# Patient Record
Sex: Female | Born: 1995 | Race: White | Hispanic: No | Marital: Single
Health system: Southern US, Community
[De-identification: ages and names within clinical notes are randomized; demographics above are authoritative.]

## PROBLEM LIST (undated history)

## (undated) ENCOUNTER — Ambulatory Visit (HOSPITAL_COMMUNITY): Payer: Self-pay

## (undated) ENCOUNTER — Inpatient Hospital Stay (HOSPITAL_COMMUNITY): Payer: Self-pay

## (undated) ENCOUNTER — Emergency Department (HOSPITAL_COMMUNITY): Admission: EM

## (undated) HISTORY — PX: HERNIA REPAIR: SHX51

---
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
## 2016-03-25 ENCOUNTER — Emergency Department (HOSPITAL_COMMUNITY)
Admission: EM | Admit: 2016-03-25 | Discharge: 2016-03-25 | Disposition: A | Discharge status: Home / Self Care | Payer: Medicaid Other | Attending: Emergency Medicine | Admitting: Emergency Medicine

## 2016-03-25 ENCOUNTER — Encounter (HOSPITAL_COMMUNITY): Payer: Self-pay

## 2016-03-25 ENCOUNTER — Emergency Department (HOSPITAL_COMMUNITY): Payer: Medicaid Other

## 2016-03-25 DIAGNOSIS — Z7722 Contact with and (suspected) exposure to environmental tobacco smoke (acute) (chronic): Secondary | ICD-10-CM | POA: Insufficient documentation

## 2016-03-25 DIAGNOSIS — R1013 Epigastric pain: Secondary | ICD-10-CM

## 2016-03-25 DIAGNOSIS — R109 Unspecified abdominal pain: Secondary | ICD-10-CM | POA: Diagnosis present

## 2016-03-25 DIAGNOSIS — Z79899 Other long term (current) drug therapy: Secondary | ICD-10-CM | POA: Insufficient documentation

## 2016-03-25 LAB — URINALYSIS, ROUTINE W REFLEX MICROSCOPIC
Bilirubin Urine: NEGATIVE
Glucose, UA: NEGATIVE mg/dL
Hgb urine dipstick: NEGATIVE
KETONES UR: NEGATIVE mg/dL
LEUKOCYTES UA: NEGATIVE
NITRITE: NEGATIVE
PH: 5.5 (ref 5.0–8.0)
Protein, ur: NEGATIVE mg/dL
Specific Gravity, Urine: 1.015 (ref 1.005–1.030)

## 2016-03-25 LAB — COMPREHENSIVE METABOLIC PANEL
ALT: 24 U/L (ref 14–54)
ANION GAP: 9 (ref 5–15)
AST: 27 U/L (ref 15–41)
Albumin: 4.1 g/dL (ref 3.5–5.0)
Alkaline Phosphatase: 43 U/L (ref 38–126)
BILIRUBIN TOTAL: 0.5 mg/dL (ref 0.3–1.2)
BUN: 12 mg/dL (ref 6–20)
CO2: 24 mmol/L (ref 22–32)
Calcium: 9.5 mg/dL (ref 8.9–10.3)
Chloride: 108 mmol/L (ref 101–111)
Creatinine, Ser: 0.68 mg/dL (ref 0.44–1.00)
Glucose, Bld: 106 mg/dL — ABNORMAL HIGH (ref 65–99)
POTASSIUM: 4.3 mmol/L (ref 3.5–5.1)
Sodium: 141 mmol/L (ref 135–145)
TOTAL PROTEIN: 6.7 g/dL (ref 6.5–8.1)

## 2016-03-25 LAB — CBC
HEMATOCRIT: 42.7 % (ref 36.0–46.0)
Hemoglobin: 14 g/dL (ref 12.0–15.0)
MCH: 30 pg (ref 26.0–34.0)
MCHC: 32.8 g/dL (ref 30.0–36.0)
MCV: 91.4 fL (ref 78.0–100.0)
Platelets: 199 10*3/uL (ref 150–400)
RBC: 4.67 MIL/uL (ref 3.87–5.11)
RDW: 12.7 % (ref 11.5–15.5)
WBC: 10.1 10*3/uL (ref 4.0–10.5)

## 2016-03-25 LAB — LIPASE, BLOOD: Lipase: 35 U/L (ref 11–51)

## 2016-03-25 LAB — POC URINE PREG, ED: Preg Test, Ur: NEGATIVE

## 2016-03-25 MED ORDER — MORPHINE SULFATE (PF) 4 MG/ML IV SOLN
4.0000 mg | Freq: Once | INTRAVENOUS | Status: AC
  Administered 2016-03-25: 4 mg via INTRAVENOUS
  Filled 2016-03-25: qty 1

## 2016-03-25 MED ORDER — IOPAMIDOL (ISOVUE-300) INJECTION 61%
INTRAVENOUS | Status: AC
  Administered 2016-03-25: 100 mL
  Filled 2016-03-25: qty 100

## 2016-03-25 MED ORDER — SODIUM CHLORIDE 0.9 % IV BOLUS (SEPSIS)
1000.0000 mL | Freq: Once | INTRAVENOUS | Status: AC
  Administered 2016-03-25: 1000 mL via INTRAVENOUS

## 2016-03-25 MED ORDER — PANTOPRAZOLE SODIUM 40 MG PO TBEC
40.0000 mg | DELAYED_RELEASE_TABLET | Freq: Once | ORAL | Status: AC
  Administered 2016-03-25: 40 mg via ORAL
  Filled 2016-03-25: qty 1

## 2016-03-25 MED ORDER — ONDANSETRON HCL 4 MG/2ML IJ SOLN
4.0000 mg | Freq: Once | INTRAMUSCULAR | Status: AC
  Administered 2016-03-25: 4 mg via INTRAVENOUS
  Filled 2016-03-25: qty 2

## 2016-03-25 MED ORDER — PANTOPRAZOLE SODIUM 20 MG PO TBEC: 20.0000 mg | DELAYED_RELEASE_TABLET | Freq: Every day | ORAL | 2 refills | Status: DC

## 2016-03-25 NOTE — ED Triage Notes (Signed)
Per pt, Pt has had stabbing squeezing pain to her epigastric region for over a year. Reports decrease with pain medication, but returns. Pt has a Hx of gallbladder removal. Reports abnormal bowels due to gallbladder removal and some nausea this morning.

## 2016-03-25 NOTE — ED Notes (Signed)
Pt in RR2

## 2016-03-25 NOTE — ED Notes (Signed)
Pt back in room.

## 2016-03-25 NOTE — ED Provider Notes (Signed)
[facility]-EMERGENCY DEPT Provider Note   CSN: [id number] Arrival date & time: 03/25/16  0850     History   Chief Complaint Chief Complaint  Patient presents with  . Abdominal Pain    HPI Tara Blanchard is a 20 y.o. female.  HPI    Tara Blanchard is a 20 y.o. female, with a history of cholecystectomy, presenting to the ED with abdominal pain intermittent for the last 6 months. Averages about 2 times a week. Woke up with the pain this morning at around 7 AM. Has been evaluated for this before and diagnosed with constipation. Endorses soft stools and diarrhea with the pain, as well as nausea. Pain rates it 7/10, epigastric, sharp in nature, radiates around the right flank.  Ibuprofen helps to relieve the pain, last dose was 600 mg at 7:30 AM this morning. BM do not affect the pain. Last BM was this morning. Denies alcohol or illicit drug use. Denies vomiting, fever/chills, hematochezia/melena,   Last food intake was last night. Currently menstruating beginning Sept 30.    History reviewed. No pertinent past medical history.  Patient Active Problem List   Diagnosis Date Noted  . Symptomatic cholelithiasis 08/09/2014    Past Surgical History:  Procedure Laterality Date  . CHOLECYSTECTOMY  08/09/2014  . CHOLECYSTECTOMY  08/09/2014   Procedure: LAPAROSCOPIC CHOLECYSTECTOMY;  Surgeon: Violeta Gelinas, MD;  Location: [facility] OR;  Service: General;;  . HERNIA REPAIR      OB History    No data available       Home Medications    Prior to Admission medications   Medication Sig Start Date End Date Taking? Authorizing Provider  acetaminophen (TYLENOL) 500 MG tablet Take 1,000 mg by mouth every 6 (six) hours as needed.    Historical Provider, MD  dicyclomine (BENTYL) 20 MG tablet Take 1 tablet (20 mg total) by mouth 4 (four) times daily -  before meals and at bedtime. 02/17/15   Lavella Hammock, MD  HYDROcodone-acetaminophen (NORCO/VICODIN) 5-325 MG per tablet Take 1-2 tablets by  mouth every 6 (six) hours as needed for moderate pain. 08/10/14   Emina Riebock, NP  pantoprazole (PROTONIX) 20 MG tablet Take 1 tablet (20 mg total) by mouth daily. 03/25/16   Lillyauna Jenkinson C Mairen Wallenstein, PA-C  polyethylene glycol powder (GLYCOLAX/MIRALAX) powder 1/2 - 1 capful in 8 oz of liquid daily as needed to have 1-2 soft bm 03/08/15   Francee Piccolo, PA-C    Family History No family history on file.  Social History Social History  Substance Use Topics  . Smoking status: Passive Smoke Exposure - Never Smoker  . Smokeless tobacco: Never Used  . Alcohol use No     Allergies   Review of patient's allergies indicates no known allergies.   Review of Systems Review of Systems  Constitutional: Negative for chills and fever.  Gastrointestinal: Positive for abdominal pain, diarrhea and nausea. Negative for blood in stool and vomiting.  Genitourinary: Positive for flank pain. Negative for hematuria and pelvic pain.  Musculoskeletal: Negative for back pain.  All other systems reviewed and are negative.    Physical Exam Updated Vital Signs BP 116/95 (BP Location: Left Arm)   Pulse 71   Temp 98.7 F (37.1 C) (Oral)   Resp 18   Ht 5\' 4"  (1.626 m)   Wt 110.2 kg   SpO2 99%   BMI 41.71 kg/m   Physical Exam  Constitutional: She appears well-developed and well-nourished. No distress.  HENT:  Head: Normocephalic and  atraumatic.  Eyes: Conjunctivae are normal.  Neck: Neck supple.  Cardiovascular: Normal rate, regular rhythm, normal heart sounds and intact distal pulses.   Pulmonary/Chest: Effort normal and breath sounds normal. No respiratory distress.  Abdominal: Soft. There is tenderness in the epigastric area. There is no guarding.  Musculoskeletal: She exhibits no edema or tenderness.  Lymphadenopathy:    She has no cervical adenopathy.  Neurological: She is alert.  Skin: Skin is warm and dry. She is not diaphoretic.  Psychiatric: She has a normal mood and affect. Her behavior is  normal.  Nursing note and vitals reviewed.    ED Treatments / Results  Labs (all labs ordered are listed, but only abnormal results are displayed) Labs Reviewed  COMPREHENSIVE METABOLIC PANEL - Abnormal; Notable for the following:       Result Value   Glucose, Bld 106 (*)    All other components within normal limits  LIPASE, BLOOD  CBC  URINALYSIS, ROUTINE W REFLEX MICROSCOPIC (NOT AT [facility])  POC URINE PREG, ED    EKG  EKG Interpretation None       Radiology Ct Abdomen Pelvis W Contrast  Result Date: 03/25/2016 CLINICAL DATA:  Stabbing squeezing pain in epigastric region for over a year, epigastric tenderness, prior cholecystectomy EXAM: CT ABDOMEN AND PELVIS WITH CONTRAST TECHNIQUE: Multidetector CT imaging of the abdomen and pelvis was performed using the standard protocol following bolus administration of intravenous contrast. Sagittal and coronal MPR images reconstructed from axial data set. CONTRAST:  ISOVUE-300 IOPAMIDOL (ISOVUE-300) INJECTION 61% IV. No oral contrast administered. COMPARISON:  None FINDINGS: Lower chest: Lung bases clear Hepatobiliary: Gallbladder surgically absent. Liver normal appearance. Pancreas: Normal appearance Spleen: Normal appearance Adrenals/Urinary Tract: Adrenal glands normal appearance. Kidneys, ureters and bladder normal appearance. Stomach/Bowel: Normal appendix. Stomach and bowel loops normal appearance Vascular/Lymphatic: Vascular structures normal appearance. Normal sized scattered retroperitoneal lymph nodes without definite abdominal or pelvic adenopathy. Reproductive: Normal appearing uterus and adnexa. Other: No free air, free fluid, hernia, or inflammatory process Musculoskeletal: Normal appearance IMPRESSION: Post cholecystectomy. No acute abdominal or pelvic abnormalities. Electronically Signed   By: Ulyses Southward M.D.   On: 03/25/2016 13:14    Procedures Procedures (including critical care time)  Medications Ordered in  ED Medications  sodium chloride 0.9 % bolus 1,000 mL (1,000 mLs Intravenous New Bag/Given 03/25/16 1057)  morphine 4 MG/ML injection 4 mg (4 mg Intravenous Given 03/25/16 1100)  ondansetron (ZOFRAN) injection 4 mg (4 mg Intravenous Given 03/25/16 1059)  pantoprazole (PROTONIX) EC tablet 40 mg (40 mg Oral Given 03/25/16 1057)  iopamidol (ISOVUE-300) 61 % injection (100 mLs  Contrast Given 03/25/16 1251)     Initial Impression / Assessment and Plan / ED Course  I have reviewed the triage vital signs and the nursing notes.  Pertinent labs & imaging results that were available during my care of the patient were reviewed by me and considered in my medical decision making (see chart for details).  Clinical Course     Patient presents with epigastric abdominal pain, intermittent for the last year. The only other linked to the patient's symptoms is soft stools that follow, but do not relieve, the pain. Although the patient states she has been having this pain for about 6 months, she has notes in the system detailing similar pain going back to over 18 months ago.  No abnormalities on the patient's labs or CT. Patient to follow up with GI. Discharge with PPI.  Findings and plan of care discussed  with Scott Goldston, MD.    Vitals:   03/25/16 [id number] Loveless53 03/25/16 1202  BP: 116/95 (!) 108/50  Pulse: 71 61  Resp: 18 17  Temp: 98.7 F (37.1 C)   TempSrc: Oral   SpO2: 99% 99%  Weight: 110.2 kg   Height: 5\' 4"  (1.626 m)      Final Clinical Impressions(s) / ED Diagnoses   Final diagnoses:  Epigastric pain    New Prescriptions New Prescriptions   PANTOPRAZOLE (PROTONIX) 20 MG TABLET    Take 1 tablet (20 mg total) by mouth daily.     Anselm PancoastShawn C Stasha Naraine, PA-C 03/25/16 1347    Pricilla LovelessScott Goldston, MD 03/25/16 1742

## 2016-03-25 NOTE — Discharge Instructions (Signed)
You have been seen today for abdominal pain. Your imaging and lab tests showed no significant abnormalities. Follow up with the gastroenterology specialists as soon as possible. Call the number provided to set up an appointment. Follow up with a primary care provider as needed. Return to ED should symptoms worsen.  You are being placed on a medication called Protonix. This is for acid reflux and to protect your stomach from stomach acid. It is possible that there is an over production of acid in your stomach. This may be what is causing your pain. Begin taking the Protonix tomorrow. Take this medication 20-30 minutes prior to the first meal of the day. Take this medication every day regardless of how you feel.

## 2016-05-15 IMAGING — DX DG ABDOMEN 1V
1 series · 1 of 1 positions shown · non-contrast
Comparison: 08/14/2014

CLINICAL DATA: Lower abdominal pain and diarrhea for 2 weeks.

EXAM:
ABDOMEN - 1 VIEW

[abdomen kub]
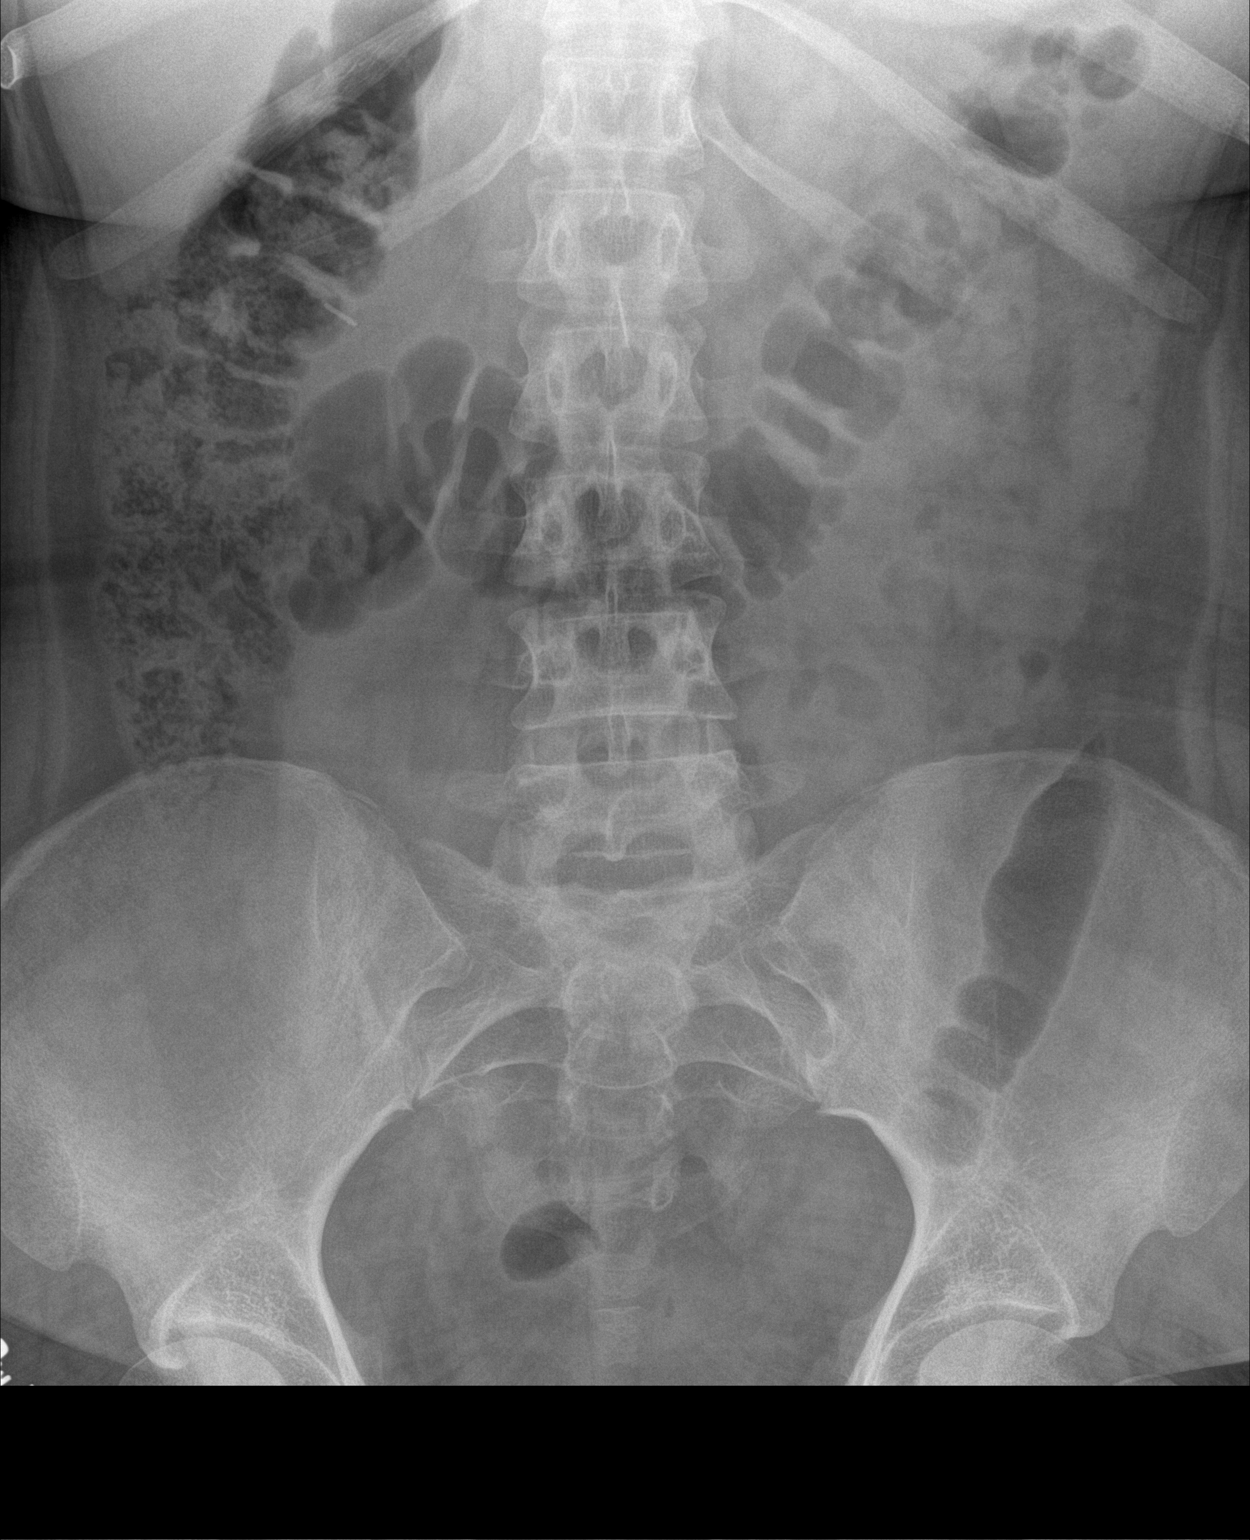

[1 of 1 positions shown; findings below may reference images not displayed]

FINDINGS: No dilated bowel loops. Small volume of stool in the right colon.
Air within normal caliber transverse and descending colon. No
evidence of free air. A surgical clip projects over the right upper
abdomen. No radiopaque calculi. No osseous abnormality.
IMPRESSION: Normal bowel gas pattern.

## 2016-06-19 ENCOUNTER — Emergency Department (HOSPITAL_COMMUNITY): Payer: Medicaid Other

## 2016-06-19 ENCOUNTER — Emergency Department (HOSPITAL_COMMUNITY)
Admission: EM | Admit: 2016-06-19 | Discharge: 2016-06-19 | Disposition: A | Discharge status: Home / Self Care | Payer: Medicaid Other | Attending: Emergency Medicine | Admitting: Emergency Medicine

## 2016-06-19 ENCOUNTER — Encounter (HOSPITAL_COMMUNITY): Payer: Self-pay

## 2016-06-19 DIAGNOSIS — Z7722 Contact with and (suspected) exposure to environmental tobacco smoke (acute) (chronic): Secondary | ICD-10-CM | POA: Insufficient documentation

## 2016-06-19 DIAGNOSIS — J069 Acute upper respiratory infection, unspecified: Secondary | ICD-10-CM | POA: Diagnosis not present

## 2016-06-19 DIAGNOSIS — B9789 Other viral agents as the cause of diseases classified elsewhere: Secondary | ICD-10-CM

## 2016-06-19 DIAGNOSIS — R05 Cough: Secondary | ICD-10-CM | POA: Diagnosis present

## 2016-06-19 LAB — RAPID STREP SCREEN (MED CTR MEBANE ONLY): Streptococcus, Group A Screen (Direct): NEGATIVE

## 2016-06-19 LAB — CBC
HCT: 42.4 % (ref 36.0–46.0)
Hemoglobin: 14.2 g/dL (ref 12.0–15.0)
MCH: 30.3 pg (ref 26.0–34.0)
MCHC: 33.5 g/dL (ref 30.0–36.0)
MCV: 90.4 fL (ref 78.0–100.0)
PLATELETS: 240 10*3/uL (ref 150–400)
RBC: 4.69 MIL/uL (ref 3.87–5.11)
RDW: 12.6 % (ref 11.5–15.5)
WBC: 10.2 10*3/uL (ref 4.0–10.5)

## 2016-06-19 LAB — COMPREHENSIVE METABOLIC PANEL
ALK PHOS: 51 U/L (ref 38–126)
ALT: 25 U/L (ref 14–54)
AST: 31 U/L (ref 15–41)
Albumin: 4 g/dL (ref 3.5–5.0)
Anion gap: 8 (ref 5–15)
BILIRUBIN TOTAL: 0.6 mg/dL (ref 0.3–1.2)
BUN: 9 mg/dL (ref 6–20)
CO2: 25 mmol/L (ref 22–32)
CREATININE: 0.74 mg/dL (ref 0.44–1.00)
Calcium: 9.3 mg/dL (ref 8.9–10.3)
Chloride: 104 mmol/L (ref 101–111)
GFR calc Af Amer: >60 mL/min (ref >60)
Glucose, Bld: 98 mg/dL (ref 65–99)
Potassium: 4.1 mmol/L (ref 3.5–5.1)
Sodium: 137 mmol/L (ref 135–145)
TOTAL PROTEIN: 7.3 g/dL (ref 6.5–8.1)

## 2016-06-19 LAB — URINALYSIS, ROUTINE W REFLEX MICROSCOPIC
BILIRUBIN URINE: NEGATIVE
GLUCOSE, UA: NEGATIVE mg/dL
Hgb urine dipstick: NEGATIVE
KETONES UR: NEGATIVE mg/dL
LEUKOCYTES UA: NEGATIVE
NITRITE: NEGATIVE
PROTEIN: NEGATIVE mg/dL
Specific Gravity, Urine: 1.019 (ref 1.005–1.030)
pH: 7 (ref 5.0–8.0)

## 2016-06-19 LAB — I-STAT BETA HCG BLOOD, ED (MC, WL, AP ONLY): I-stat hCG, quantitative: <5 m[IU]/mL (ref <5)

## 2016-06-19 LAB — LIPASE, BLOOD: Lipase: 18 U/L (ref 11–51)

## 2016-06-19 MED ORDER — PROMETHAZINE HCL 25 MG PO TABS
25.0000 mg | ORAL_TABLET | Freq: Four times a day (QID) | ORAL | 0 refills | Status: AC | PRN
Start: 2016-06-19 — End: ?

## 2016-06-19 MED ORDER — ONDANSETRON 4 MG PO TBDP
ORAL_TABLET | ORAL | Status: AC
  Filled 2016-06-19: qty 1

## 2016-06-19 MED ORDER — SODIUM CHLORIDE 0.9 % IV BOLUS (SEPSIS)
1000.0000 mL | Freq: Once | INTRAVENOUS | Status: AC
  Administered 2016-06-19: 1000 mL via INTRAVENOUS

## 2016-06-19 MED ORDER — MAGIC MOUTHWASH
10.0000 mL | Freq: Once | ORAL | Status: AC
  Administered 2016-06-19: 10 mL via ORAL
  Filled 2016-06-19: qty 10

## 2016-06-19 MED ORDER — CHLORHEXIDINE GLUCONATE 0.12 % MT SOLN: 15.0000 mL | Freq: Two times a day (BID) | OROMUCOSAL | 0 refills | Status: DC

## 2016-06-19 MED ORDER — ACETAMINOPHEN 325 MG PO TABS
650.0000 mg | ORAL_TABLET | Freq: Once | ORAL | Status: AC
  Administered 2016-06-19: 650 mg via ORAL
  Filled 2016-06-19: qty 2

## 2016-06-19 MED ORDER — ONDANSETRON 4 MG PO TBDP
4.0000 mg | ORAL_TABLET | Freq: Once | ORAL | Status: AC | PRN
  Administered 2016-06-19: 4 mg via ORAL

## 2016-06-19 NOTE — ED Triage Notes (Signed)
Pt presents for URI symptoms x 3 days. Pt. States nasal congestion and sore throat. Pt. States has been coughing up yellow sputum. Pt. Also reports intermittent N/V/D. States no episodes of diarrhea today and 2 episode of emesis. Pt. AxO x4.

## 2016-06-19 NOTE — ED Provider Notes (Signed)
[facility] Provider Note   CSN: [id number] Arrival date & time: 06/19/16  1557     History   Chief Complaint Chief Complaint  Patient presents with  . URI  . Emesis    HPI Tara Blanchard is a 20 y.o. female.  HPI   20 year old female presents with URI symptoms. Patient report for the past 3 days she has had subjective fever, chills, headache, runny nose, sneezing, coughing, throat irritation. Yesterday she also developed nausea, has several episodes of nonbloody nonbilious emesis and loose stools. She was hanging out with a friend with similar symptoms recently. She has tried over-the-counter medication with minimal improvement. No recent catheterization. No history of asthma. She is a nonsmoker. She did not have a flu shot or pneumonia shot this year. She was nauseous earlier but states that after receiving Zofran ODT for symptom improves. She does report having decreased appetite and feels dehydrated.  History reviewed. No pertinent past medical history.  Patient Active Problem List   Diagnosis Date Noted  . Symptomatic cholelithiasis 08/09/2014    Past Surgical History:  Procedure Laterality Date  . CHOLECYSTECTOMY  08/09/2014  . CHOLECYSTECTOMY  08/09/2014   Procedure: LAPAROSCOPIC CHOLECYSTECTOMY;  Surgeon: Violeta GelinasBurke Thompson, MD;  [facility];  Service: General;;  . HERNIA REPAIR      OB History    No data available       Home Medications    Prior to Admission medications   Medication Sig Start Date End Date Taking? Authorizing Provider  acetaminophen (TYLENOL) 500 MG tablet Take 1,000 mg by mouth every 6 (six) hours as needed.    Historical Provider, MD  dicyclomine (BENTYL) 20 MG tablet Take 1 tablet (20 mg total) by mouth 4 (four) times daily -  before meals and at bedtime. 02/17/15   Lavella HammockEndya Frye, MD  HYDROcodone-acetaminophen (NORCO/VICODIN) 5-325 MG per tablet Take 1-2 tablets by mouth every 6 (six) hours as needed for moderate pain. 08/10/14    Emina Riebock, NP  pantoprazole (PROTONIX) 20 MG tablet Take 1 tablet (20 mg total) by mouth daily. 03/25/16   Shawn C Joy, PA-C  polyethylene glycol powder (GLYCOLAX/MIRALAX) powder 1/2 - 1 capful in 8 oz of liquid daily as needed to have 1-2 soft bm 03/08/15   Francee PiccoloJennifer Piepenbrink, PA-C    Family History No family history on file.  Social History Social History  Substance Use Topics  . Smoking status: Passive Smoke Exposure - Never Smoker  . Smokeless tobacco: Never Used  . Alcohol use No     Allergies   Patient has no known allergies.   Review of Systems Review of Systems  All other systems reviewed and are negative.    Physical Exam Updated Vital Signs BP 105/71 (BP Location: Right Arm)   Pulse 86   Temp 99 F (37.2 C) (Oral)   Resp 18   Ht 5\' 4"  (1.626 m)   LMP 05/29/2016 (Exact Date)   SpO2 100%   Physical Exam  Constitutional: She is oriented to person, place, and time. She appears well-developed and well-nourished. No distress.  Obese female resting in bed in no acute discomfort.  HENT:  Head: Atraumatic.  Right Ear: External ear normal.  Left Ear: External ear normal.  Nose: Nose normal.  Mouth/Throat: Oropharynx is clear and moist.  Eyes: Conjunctivae are normal.  Neck: Normal range of motion. Neck supple.  No nuchal rigidity  Cardiovascular: Normal rate, regular rhythm and intact distal pulses.   Pulmonary/Chest: Effort normal  and breath sounds normal.  Abdominal: Soft. She exhibits no distension. There is no tenderness.  Neurological: She is alert and oriented to person, place, and time.  Skin: No rash noted.  Psychiatric: She has a normal mood and affect.  Nursing note and vitals reviewed.    ED Treatments / Results  Labs (all labs ordered are listed, but only abnormal results are displayed) Labs Reviewed  RAPID STREP SCREEN ([facility])  CULTURE, GROUP A STREP ([facility])  LIPASE, BLOOD  COMPREHENSIVE METABOLIC PANEL  CBC  URINALYSIS,  ROUTINE W REFLEX MICROSCOPIC  I-STAT BETA HCG BLOOD, ED ([facility], [facility], [facility] ONLY)    EKG  EKG Interpretation None       Radiology Dg Chest 2 View  Result Date: 06/19/2016 CLINICAL DATA:  Sore throat with cough, congestion, nausea and vomiting for 3 days. History of asthma and diabetes. EXAM: CHEST  2 VIEW COMPARISON:  Limited correlation made with abdominal CT 03/25/2016. FINDINGS: The heart size and mediastinal contours are normal. The lungs are clear. There is no pleural effusion or pneumothorax. No acute osseous findings are identified. IMPRESSION: No active cardiopulmonary process. Electronically Signed   By: Carey BullocksWilliam  Veazey M.D.   On: 06/19/2016 19:59    Procedures Procedures (including critical care time)  Medications Ordered in ED Medications  ondansetron (ZOFRAN-ODT) 4 MG disintegrating tablet (not administered)  ondansetron (ZOFRAN-ODT) disintegrating tablet 4 mg (4 mg Oral Given 06/19/16 1627)     Initial Impression / Assessment and Plan / ED Course  I have reviewed the triage vital signs and the nursing notes.  Pertinent labs & imaging results that were available during my care of the patient were reviewed by me and considered in my medical decision making (see chart for details).  Clinical Course     BP 101/58   Pulse 103   Temp 99 F (37.2 C) (Oral)   Resp 20   Ht 5\' 4"  (1.626 m)   LMP 05/29/2016 (Exact Date)   SpO2 98%    Final Clinical Impressions(s) / ED Diagnoses   Final diagnoses:  Viral URI with cough    New Prescriptions New Prescriptions   CHLORHEXIDINE (PERIDEX) 0.12 % SOLUTION    Use as directed 15 mLs in the mouth or throat 2 (two) times daily.   PROMETHAZINE (PHENERGAN) 25 MG TABLET    Take 1 tablet (25 mg total) by mouth every 6 (six) hours as needed for nausea or vomiting.   7:43 PM Patient here with cold symptoms. Her blood pressure is a bit soft and she may benefit from gentle IV hydration. Her labs are reassuring. Currently awaits chest  x-ray to rule out pneumonia. Suspect viral etiology.  10:02 PM CXR without acute infiltrates.  Labs are reassuring.  Pt received IVF, felt better.  Strep test negative, throat culture sent.  Magic mouthwash given for sore throat.  Pt d/c with Phenergan and Peridex for sxs treatment.  Return precaution given.     Fayrene HelperBowie Bastion Bolger, PA-C 06/19/16 2211    Shaune Pollackameron Isaacs, MD 06/20/16 [phone]

## 2016-06-22 LAB — CULTURE, GROUP A STREP (THRC)

## 2017-05-17 ENCOUNTER — Other Ambulatory Visit: Payer: Self-pay

## 2017-05-17 ENCOUNTER — Ambulatory Visit (HOSPITAL_COMMUNITY)
Admission: EM | Admit: 2017-05-17 | Discharge: 2017-05-17 | Disposition: A | Discharge status: Home / Self Care | Payer: Self-pay | Attending: Internal Medicine | Admitting: Internal Medicine

## 2017-05-17 ENCOUNTER — Encounter (HOSPITAL_COMMUNITY): Payer: Self-pay | Admitting: *Deleted

## 2017-05-17 DIAGNOSIS — N939 Abnormal uterine and vaginal bleeding, unspecified: Secondary | ICD-10-CM | POA: Insufficient documentation

## 2017-05-17 LAB — POCT URINALYSIS DIP (DEVICE)
Bilirubin Urine: NEGATIVE
GLUCOSE, UA: NEGATIVE mg/dL
HGB URINE DIPSTICK: NEGATIVE
Ketones, ur: NEGATIVE mg/dL
LEUKOCYTES UA: NEGATIVE
NITRITE: NEGATIVE
Protein, ur: NEGATIVE mg/dL
Specific Gravity, Urine: 1.025 (ref 1.005–1.030)
UROBILINOGEN UA: 0.2 mg/dL (ref 0.0–1.0)
pH: 6.5 (ref 5.0–8.0)

## 2017-05-17 LAB — POCT PREGNANCY, URINE: Preg Test, Ur: NEGATIVE

## 2017-05-17 NOTE — Discharge Instructions (Signed)
If develop abdominal pain, increased bleeding, dizziness, weakness, odor or vaginal discharge please be seen again. Will notify of any positive findings and if any changes to treatment are needed. Please establish with a primary care provider or Gynecologist for routine well woman exam and initiation of birth control. Use of condoms to prevent STDs

## 2017-05-17 NOTE — ED Triage Notes (Signed)
C/O LMP 11/7-11/11/18.  Had small amount vaginal bleeding following sex 2 days ago.  Yesterday had large amount vaginal bleeding that has since resolved, but mother concerned since pt has never had GYN exam.  Has taken Plan B pill 3 times over past few months - most recently approx 1 month ago.

## 2017-05-17 NOTE — ED Provider Notes (Signed)
[facility]    CSN: [id number] Arrival date & time: 05/17/17  1605     History   Chief Complaint Chief Complaint  Patient presents with  . Vaginal Bleeding    HPI Tara Blanchard is a 21 y.o. female.   Tara Blanchard presents with complaints of vaginal spotting yesterday which was unusual for her. LMP 11/7-11/11. While using the toilet she noted red blood to toilet paper yesterday. Did not have to use a pad or tampon. Has not bled since. Is sexually active. Is not on any birth control. Does not always use condoms. Denies abdominal pain, vaginal itching, discharge, known exposure to std's, fever or back pain. Denies urinary symptoms. Has not had any bleeding today. Has taken Plan B pill, last was in October.    ROS per HPI.       History reviewed. No pertinent past medical history.  Patient Active Problem List   Diagnosis Date Noted  . Symptomatic cholelithiasis 08/09/2014    Past Surgical History:  Procedure Laterality Date  . CHOLECYSTECTOMY  08/09/2014  . CHOLECYSTECTOMY  08/09/2014   Procedure: LAPAROSCOPIC CHOLECYSTECTOMY;  Surgeon: Violeta GelinasBurke Thompson, MD;  [facility];  Service: General;;  . HERNIA REPAIR      OB History    No data available       Home Medications    Prior to Admission medications   Medication Sig Start Date End Date Taking? Authorizing Provider  Multiple Vitamins-Minerals (MULTIVITAMIN GUMMIES ADULT PO) Take by mouth.   Yes [provider]  acetaminophen (TYLENOL) 500 MG tablet Take 1,000 mg by mouth every 6 (six) hours as needed.    [provider]  chlorhexidine (PERIDEX) 0.12 % solution Use as directed 15 mLs in the mouth or throat 2 (two) times daily. 06/19/16   Fayrene Helperran, [place], PA-C  promethazine (PHENERGAN) 25 MG tablet Take 1 tablet (25 mg total) by mouth every 6 (six) hours as needed for nausea or vomiting. 06/19/16   Fayrene Helperran, [place], PA-C  pseudoephedrine-dextromethorphan-guaifenesin (ROBITUSSIN-PE)  30-10-100 MG/5ML solution Take 10 mLs by mouth 4 (four) times daily as needed for cough.    [provider]    Family History History reviewed. No pertinent family history.  Social History Social History   Tobacco Use  . Smoking status: Passive Smoke Exposure - Never Smoker  . Smokeless tobacco: Never Used  Substance Use Topics  . Alcohol use: No  . Drug use: No     Allergies   Patient has no known allergies.   Review of Systems Review of Systems   Physical Exam Triage Vital Signs ED Triage Vitals [05/17/17 1659]  Enc Vitals Group     BP 117/72     Pulse Rate 82     Resp 16     Temp 99 F (37.2 C)     Temp Source Oral     SpO2 99 %     Weight      Height      Head Circumference      Peak Flow      Pain Score      Pain Loc      Pain Edu?      Excl. in [facility]?    No data found.  Updated Vital Signs BP 117/72   Pulse 82   Temp 99 F (37.2 C) (Oral)   Resp 16   LMP 04/30/2017 (Exact Date)   SpO2 99%   Visual Acuity Right Eye Distance:   Left Eye  Distance:   Bilateral Distance:    Right Eye Near:   Left Eye Near:    Bilateral Near:     Physical Exam  Constitutional: She is oriented to person, place, and time. She appears well-developed and well-nourished. No distress.  Cardiovascular: Normal rate, regular rhythm and normal heart sounds.  Pulmonary/Chest: Effort normal and breath sounds normal.  Abdominal: Soft. There is no tenderness.  Genitourinary: Vagina normal. Cervix exhibits no motion tenderness, no discharge and no friability. Right adnexum displays no mass, no tenderness and no fullness. Left adnexum displays no mass, no tenderness and no fullness.  Genitourinary Comments: Cervix slightly opened, thin clear discharge only without any bleeding present; without CMT  Neurological: She is alert and oriented to person, place, and time.  Skin: Skin is warm and dry.     UC Treatments / Results  Labs (all labs ordered are listed, but  only abnormal results are displayed) Labs Reviewed  POCT PREGNANCY, URINE  POCT URINALYSIS DIP (DEVICE)  URINE CYTOLOGY ANCILLARY ONLY    EKG  EKG Interpretation None       Radiology No results found.  Procedures Procedures (including critical care time)  Medications Ordered in UC Medications - No data to display   Initial Impression / Assessment and Plan / UC Course  I have reviewed the triage vital signs and the nursing notes.  Pertinent labs & imaging results that were available during my care of the patient were reviewed by me and considered in my medical decision making (see chart for details).     Cyclical changes due to Plan B last month vs traumatic injury from intercourse the previous day vs spotting related to ovulation discussed with patient. Without active bleeding. Without pain or other signs of infectious process at this time. Will notify of any positive findings and if any changes to treatment are needed. To establish with PCP or gynecologist for routine care, patient states she is interested in starting birth control but is unsure yet what kind. Return precautions provided. Patient verbalized understanding and agreeable to plan.   Final Clinical Impressions(s) / UC Diagnoses   Final diagnoses:  Abnormal vaginal bleeding    ED Discharge Orders    None       Controlled Substance Prescriptions [place] Controlled Substance Registry consulted? Not Applicable   Georgetta HaberBurky, Natalie B, NP 05/17/17 [phone]

## 2017-05-19 LAB — URINE CYTOLOGY ANCILLARY ONLY
CHLAMYDIA, DNA PROBE: NEGATIVE
NEISSERIA GONORRHEA: NEGATIVE
Trichomonas: NEGATIVE

## 2017-05-21 LAB — URINE CYTOLOGY ANCILLARY ONLY
BACTERIAL VAGINITIS: NEGATIVE
CANDIDA VAGINITIS: NEGATIVE

## 2019-08-17 ENCOUNTER — Emergency Department (HOSPITAL_COMMUNITY)
Admission: EM | Admit: 2019-08-17 | Discharge: 2019-08-18 | Disposition: A | Discharge status: Home / Self Care | Payer: Self-pay | Attending: Emergency Medicine | Admitting: Emergency Medicine

## 2019-08-17 ENCOUNTER — Encounter (HOSPITAL_COMMUNITY): Payer: Self-pay | Admitting: Emergency Medicine

## 2019-08-17 ENCOUNTER — Emergency Department (HOSPITAL_COMMUNITY): Payer: Self-pay

## 2019-08-17 ENCOUNTER — Other Ambulatory Visit: Payer: Self-pay

## 2019-08-17 DIAGNOSIS — Z79899 Other long term (current) drug therapy: Secondary | ICD-10-CM | POA: Insufficient documentation

## 2019-08-17 DIAGNOSIS — Z7722 Contact with and (suspected) exposure to environmental tobacco smoke (acute) (chronic): Secondary | ICD-10-CM | POA: Insufficient documentation

## 2019-08-17 DIAGNOSIS — M79671 Pain in right foot: Secondary | ICD-10-CM | POA: Insufficient documentation

## 2019-08-17 NOTE — ED Triage Notes (Signed)
Pt was at work and dropped a 10lb block of ice on her right foot.

## 2019-08-18 NOTE — ED Provider Notes (Signed)
[facility] EMERGENCY DEPARTMENT Provider Note   CSN: [id number] Arrival date & time: 08/17/19  2300     History Chief Complaint  Patient presents with  . Foot Injury    Tara Blanchard is a 24 y.o. female.  The history is provided by the patient and medical records.  Foot Injury   24 year old female presenting to the ED with right foot pain.  States she was at work and a bag of ice had melted and refrozen again into a solid block of ice.  States she was trying to load this to leave when it slipped out of her hand and fell on the top of her right foot.  She had immediate onset of pain.  She has been able to walk but is walking on her heel or the side of her foot.  She denies any numbness or weakness.  No prior foot injuries or surgeries in the past. Ice applied here in triage.  History reviewed. No pertinent past medical history.  Patient Active Problem List   Diagnosis Date Noted  . Symptomatic cholelithiasis 08/09/2014    Past Surgical History:  Procedure Laterality Date  . CHOLECYSTECTOMY  08/09/2014  . CHOLECYSTECTOMY  08/09/2014   Procedure: LAPAROSCOPIC CHOLECYSTECTOMY;  Surgeon: Georganna Skeans, MD;  Location: [facility];  Service: General;;  . HERNIA REPAIR       OB History   No obstetric history on file.     No family history on file.  Social History   Tobacco Use  . Smoking status: Passive Smoke Exposure - Never Smoker  . Smokeless tobacco: Never Used  Substance Use Topics  . Alcohol use: No  . Drug use: No    Home Medications Prior to Admission medications   Medication Sig Start Date End Date Taking? Authorizing Provider  acetaminophen (TYLENOL) 500 MG tablet Take 1,000 mg by mouth every 6 (six) hours as needed.    [provider]  chlorhexidine (PERIDEX) 0.12 % solution Use as directed 15 mLs in the mouth or throat 2 (two) times daily. 06/19/16   Domenic Moras, PA-C  Multiple Vitamins-Minerals (MULTIVITAMIN GUMMIES ADULT  PO) Take by mouth.    [provider]  promethazine (PHENERGAN) 25 MG tablet Take 1 tablet (25 mg total) by mouth every 6 (six) hours as needed for nausea or vomiting. 06/19/16   Domenic Moras, PA-C  pseudoephedrine-dextromethorphan-guaifenesin (ROBITUSSIN-PE) 30-10-100 MG/5ML solution Take 10 mLs by mouth 4 (four) times daily as needed for cough.    [provider]    Allergies    Patient has no known allergies.  Review of Systems   Review of Systems  Musculoskeletal: Positive for arthralgias.  All other systems reviewed and are negative.   Physical Exam Updated Vital Signs BP 120/70   Pulse 78   Temp 100.3 F (37.9 C) (Oral)   Resp 16   LMP 07/21/2019 (Approximate) Comment: shielded  SpO2 97%   Physical Exam Vitals and nursing note reviewed.  Constitutional:      Appearance: She is well-developed.  HENT:     Head: Normocephalic and atraumatic.  Eyes:     Conjunctiva/sclera: Conjunctivae normal.     Pupils: Pupils are equal, round, and reactive to light.  Cardiovascular:     Rate and Rhythm: Normal rate and regular rhythm.     Heart sounds: Normal heart sounds.  Pulmonary:     Effort: Pulmonary effort is normal.     Breath sounds: Normal breath sounds.  Abdominal:  General: Bowel sounds are normal.     Palpations: Abdomen is soft.  Musculoskeletal:        General: Normal range of motion.     Cervical back: Normal range of motion.     Comments: Right foot grossly normal in appearance, there is some mild swelling along the right first and second metatarsals without bruising, open wound, or deformity, tenderness to palpation in this area, able to wiggle toes normally, normal distal sensation and cap refill, DP pulse intact, compartments are soft and easily compressible  Skin:    General: Skin is warm and dry.  Neurological:     Mental Status: She is alert and oriented to person, place, and time.     ED Results / Procedures / Treatments    Labs (all labs ordered are listed, but only abnormal results are displayed) Labs Reviewed - No data to display  EKG None  Radiology DG Foot Complete Right  Result Date: 08/17/2019 CLINICAL DATA:  Crush injury EXAM: RIGHT FOOT COMPLETE - 3+ VIEW COMPARISON:  None. FINDINGS: There is no evidence of fracture or dislocation. There is no evidence of arthropathy or other focal bone abnormality. Soft tissues are unremarkable. IMPRESSION: Negative. Electronically Signed   By: Jasmine Pang M.D.   On: 08/17/2019 23:39    Procedures Procedures (including critical care time)  Medications Ordered in ED Medications - No data to display  ED Course  I have reviewed the triage vital signs and the nursing notes.  Pertinent labs & imaging results that were available during my care of the patient were reviewed by me and considered in my medical decision making (see chart for details).    MDM Rules/Calculators/A&P  24 year old F here with right foot pain after dropping block of ice on it at work.  Pain and tenderness along right 1st and 2nd metatarsals without noted deformity.  Foot is NVI.  X-ray negative, suspect bony contusion.  Ace wrap and post-op shoe applied.  Crutches given, will progress back to full weightbearing as tolerated.  Discussed pain control with tylenol/motrin.  Follow-up with PCP.  Return here for any new/acute changes.  Final Clinical Impression(s) / ED Diagnoses Final diagnoses:  Right foot pain    Rx / DC Orders ED Discharge Orders    None       Garlon Hatchet, PA-C 08/18/19 0300    Dione Booze, MD 08/18/19 [phone]

## 2019-08-18 NOTE — Discharge Instructions (Signed)
Recommend tylenol or motrin for pain. Can use crutches for now, progress back to full weight bearing as tolerated. Follow-up with your primary care doctor. Return here for any new/acute changes.

## 2021-05-22 ENCOUNTER — Encounter (HOSPITAL_COMMUNITY): Payer: Self-pay

## 2021-05-22 ENCOUNTER — Emergency Department (HOSPITAL_COMMUNITY)
Admission: EM | Admit: 2021-05-22 | Discharge: 2021-05-22 | Disposition: A | Discharge status: Home / Self Care | Payer: No Typology Code available for payment source | Attending: Emergency Medicine | Admitting: Emergency Medicine

## 2021-05-22 ENCOUNTER — Emergency Department (HOSPITAL_COMMUNITY): Payer: No Typology Code available for payment source

## 2021-05-22 ENCOUNTER — Other Ambulatory Visit: Payer: Self-pay

## 2021-05-22 DIAGNOSIS — S79912A Unspecified injury of left hip, initial encounter: Secondary | ICD-10-CM | POA: Diagnosis not present

## 2021-05-22 DIAGNOSIS — F419 Anxiety disorder, unspecified: Secondary | ICD-10-CM | POA: Diagnosis not present

## 2021-05-22 DIAGNOSIS — Y9389 Activity, other specified: Secondary | ICD-10-CM | POA: Insufficient documentation

## 2021-05-22 DIAGNOSIS — S0990XA Unspecified injury of head, initial encounter: Secondary | ICD-10-CM | POA: Insufficient documentation

## 2021-05-22 DIAGNOSIS — R000 Tachycardia, unspecified: Secondary | ICD-10-CM | POA: Insufficient documentation

## 2021-05-22 DIAGNOSIS — S3991XA Unspecified injury of abdomen, initial encounter: Secondary | ICD-10-CM | POA: Insufficient documentation

## 2021-05-22 DIAGNOSIS — E669 Obesity, unspecified: Secondary | ICD-10-CM | POA: Insufficient documentation

## 2021-05-22 DIAGNOSIS — N9489 Other specified conditions associated with female genital organs and menstrual cycle: Secondary | ICD-10-CM | POA: Diagnosis not present

## 2021-05-22 DIAGNOSIS — S4992XA Unspecified injury of left shoulder and upper arm, initial encounter: Secondary | ICD-10-CM | POA: Diagnosis not present

## 2021-05-22 DIAGNOSIS — Y92410 Unspecified street and highway as the place of occurrence of the external cause: Secondary | ICD-10-CM | POA: Insufficient documentation

## 2021-05-22 DIAGNOSIS — Z7722 Contact with and (suspected) exposure to environmental tobacco smoke (acute) (chronic): Secondary | ICD-10-CM | POA: Insufficient documentation

## 2021-05-22 DIAGNOSIS — T1490XA Injury, unspecified, initial encounter: Secondary | ICD-10-CM

## 2021-05-22 LAB — COMPREHENSIVE METABOLIC PANEL
ALT: 28 U/L (ref 0–44)
AST: 30 U/L (ref 15–41)
Albumin: 3.8 g/dL (ref 3.5–5.0)
Alkaline Phosphatase: 42 U/L (ref 38–126)
Anion gap: 8 (ref 5–15)
BUN: 11 mg/dL (ref 6–20)
CO2: 22 mmol/L (ref 22–32)
Calcium: 8.8 mg/dL — ABNORMAL LOW (ref 8.9–10.3)
Chloride: 107 mmol/L (ref 98–111)
Creatinine, Ser: 0.76 mg/dL (ref 0.44–1.00)
GFR, Estimated: >60 mL/min (ref >60)
Glucose, Bld: 103 mg/dL — ABNORMAL HIGH (ref 70–99)
Potassium: 4 mmol/L (ref 3.5–5.1)
Sodium: 137 mmol/L (ref 135–145)
Total Bilirubin: 1 mg/dL (ref 0.3–1.2)
Total Protein: 6.7 g/dL (ref 6.5–8.1)

## 2021-05-22 LAB — SAMPLE TO BLOOD BANK

## 2021-05-22 LAB — I-STAT CHEM 8, ED
BUN: 13 mg/dL (ref 6–20)
Calcium, Ion: 1.09 mmol/L — ABNORMAL LOW (ref 1.15–1.40)
Chloride: 108 mmol/L (ref 98–111)
Creatinine, Ser: 0.7 mg/dL (ref 0.44–1.00)
Glucose, Bld: 98 mg/dL (ref 70–99)
HCT: 44 % (ref 36.0–46.0)
Hemoglobin: 15 g/dL (ref 12.0–15.0)
Potassium: 4 mmol/L (ref 3.5–5.1)
Sodium: 140 mmol/L (ref 135–145)
TCO2: 23 mmol/L (ref 22–32)

## 2021-05-22 LAB — CBC
HCT: 45.1 % (ref 36.0–46.0)
Hemoglobin: 14.6 g/dL (ref 12.0–15.0)
MCH: 30.4 pg (ref 26.0–34.0)
MCHC: 32.4 g/dL (ref 30.0–36.0)
MCV: 93.8 fL (ref 80.0–100.0)
Platelets: 189 10*3/uL (ref 150–400)
RBC: 4.81 MIL/uL (ref 3.87–5.11)
RDW: 12.4 % (ref 11.5–15.5)
WBC: 8.6 10*3/uL (ref 4.0–10.5)
nRBC: 0 % (ref 0.0–0.2)

## 2021-05-22 LAB — I-STAT BETA HCG BLOOD, ED (MC, WL, AP ONLY): I-stat hCG, quantitative: <5 m[IU]/mL (ref <5)

## 2021-05-22 LAB — PROTIME-INR
INR: 0.9 (ref 0.8–1.2)
Prothrombin Time: 12.4 seconds (ref 11.4–15.2)

## 2021-05-22 LAB — LACTIC ACID, PLASMA: Lactic Acid, Venous: 1.2 mmol/L (ref 0.5–1.9)

## 2021-05-22 MED ORDER — DICLOFENAC SODIUM 1 % EX GEL: 2.0000 g | Freq: Four times a day (QID) | CUTANEOUS | 0 refills | Status: DC

## 2021-05-22 MED ORDER — METHOCARBAMOL 500 MG PO TABS: 1000.0000 mg | ORAL_TABLET | Freq: Three times a day (TID) | ORAL | 0 refills | Status: DC | PRN

## 2021-05-22 MED ORDER — IBUPROFEN 800 MG PO TABS: 800.0000 mg | ORAL_TABLET | Freq: Three times a day (TID) | ORAL | 0 refills | Status: DC | PRN

## 2021-05-22 MED ORDER — FENTANYL CITRATE PF 50 MCG/ML IJ SOSY: 100.0000 ug | PREFILLED_SYRINGE | INTRAMUSCULAR | Status: DC | PRN

## 2021-05-22 MED ORDER — IOHEXOL 300 MG/ML  SOLN
100.0000 mL | Freq: Once | INTRAMUSCULAR | Status: AC | PRN
  Administered 2021-05-22: 100 mL via INTRAVENOUS

## 2021-05-22 NOTE — ED Notes (Signed)
Patient transported to CT 
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
## 2021-05-22 NOTE — ED Notes (Signed)
Pt refusing covid swab

## 2021-05-22 NOTE — Discharge Instructions (Signed)

## 2021-05-22 NOTE — ED Notes (Signed)
Per previous RN pt refused covid swab
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
## 2024-01-13 ENCOUNTER — Inpatient Hospital Stay (HOSPITAL_COMMUNITY)
Admission: AD | Admit: 2024-01-13 | Discharge: 2024-01-14 | Disposition: A | Discharge status: Home / Self Care | Payer: Self-pay | Attending: Obstetrics and Gynecology | Admitting: Obstetrics and Gynecology

## 2024-01-13 DIAGNOSIS — N939 Abnormal uterine and vaginal bleeding, unspecified: Secondary | ICD-10-CM | POA: Insufficient documentation

## 2024-01-13 DIAGNOSIS — R102 Pelvic and perineal pain: Secondary | ICD-10-CM | POA: Insufficient documentation

## 2024-01-13 DIAGNOSIS — Z3202 Encounter for pregnancy test, result negative: Secondary | ICD-10-CM | POA: Insufficient documentation

## 2024-01-14 ENCOUNTER — Encounter (HOSPITAL_COMMUNITY): Payer: Self-pay | Admitting: *Deleted

## 2024-01-14 DIAGNOSIS — R102 Pelvic and perineal pain: Secondary | ICD-10-CM

## 2024-01-14 DIAGNOSIS — N939 Abnormal uterine and vaginal bleeding, unspecified: Secondary | ICD-10-CM

## 2024-01-14 DIAGNOSIS — Z3202 Encounter for pregnancy test, result negative: Secondary | ICD-10-CM

## 2024-01-14 LAB — COMPREHENSIVE METABOLIC PANEL WITH GFR
ALT: 22 U/L (ref 0–44)
AST: 25 U/L (ref 15–41)
Albumin: 3.5 g/dL (ref 3.5–5.0)
Alkaline Phosphatase: 37 U/L — ABNORMAL LOW (ref 38–126)
Anion gap: 8 (ref 5–15)
BUN: 11 mg/dL (ref 6–20)
CO2: 23 mmol/L (ref 22–32)
Calcium: 8.7 mg/dL — ABNORMAL LOW (ref 8.9–10.3)
Chloride: 108 mmol/L (ref 98–111)
Creatinine, Ser: 0.59 mg/dL (ref 0.44–1.00)
GFR, Estimated: >60 mL/min (ref >60)
Glucose, Bld: 100 mg/dL — ABNORMAL HIGH (ref 70–99)
Potassium: 3.6 mmol/L (ref 3.5–5.1)
Sodium: 139 mmol/L (ref 135–145)
Total Bilirubin: 0.9 mg/dL (ref 0.0–1.2)
Total Protein: 6.3 g/dL — ABNORMAL LOW (ref 6.5–8.1)

## 2024-01-14 LAB — CBC
HCT: 39.5 % (ref 36.0–46.0)
Hemoglobin: 13.1 g/dL (ref 12.0–15.0)
MCH: 30 pg (ref 26.0–34.0)
MCHC: 33.2 g/dL (ref 30.0–36.0)
MCV: 90.6 fL (ref 80.0–100.0)
Platelets: 190 K/uL (ref 150–400)
RBC: 4.36 MIL/uL (ref 3.87–5.11)
RDW: 12.8 % (ref 11.5–15.5)
WBC: 8.2 K/uL (ref 4.0–10.5)
nRBC: 0 % (ref 0.0–0.2)

## 2024-01-14 LAB — ABO/RH: ABO/RH(D): A POS

## 2024-01-14 LAB — POCT PREGNANCY, URINE: Preg Test, Ur: NEGATIVE

## 2024-01-14 LAB — HCG, QUANTITATIVE, PREGNANCY: hCG, Beta Chain, Quant, S: <1 m[IU]/mL (ref <5)

## 2024-01-14 NOTE — MAU Note (Signed)
 Tara Blanchard is a 27 y.o. at Unknown here in MAU reporting not having her period for 3months or more. States she had what she thinks is a faintly positive upt 3-4wks ago. Today she started bleeding and cramping.   LMP: unknown Onset of complaint: today Pain score: 8 Vitals:   01/13/24 2358 01/14/24 0001  BP:  122/73  Pulse: 80   Resp: 16   Temp: 98.8 F (37.1 C)   SpO2: 99%      FHT: na  Lab orders placed from triage: upt

## 2024-01-14 NOTE — MAU Provider Note (Signed)
 Chief Complaint: Abdominal Pain and Vaginal Bleeding Seen by provider 01/14/24 at 0040      SUBJECTIVE HPI: Tara Blanchard is a 28 y.o. G1P0 at Unknown by LMP who presents to maternity admissions reporting missing menses for 3 months.  This is not new for her.  Has never been worked up for irregular menses. .. She denies urinary symptoms, n/v, or fever/chills.    Abdominal Pain Pertinent negatives include no diarrhea.  Vaginal Bleeding The pain is mild. Associated symptoms include abdominal pain. Pertinent negatives include no back pain, chills or diarrhea. Nothing aggravates the symptoms. She has tried nothing for the symptoms. She is not sexually active. It is unknown whether or not her partner has an STD. She uses condoms for contraception.   RN Note: Tara Blanchard is a 28 y.o. at Unknown here in MAU reporting not having her period for 3months or more. States she had what she thinks is a faintly positive upt 3-4wks ago. Today she started bleeding and cramping.   No past medical history on file. Past Surgical History:  Procedure Laterality Date   CHOLECYSTECTOMY  08/09/2014   CHOLECYSTECTOMY  08/09/2014   Procedure: LAPAROSCOPIC CHOLECYSTECTOMY;  Surgeon: Dann Hummer, MD;  Location: [facility] OR;  Service: General;;   HERNIA REPAIR     Social History   Socioeconomic History   Marital status: Single    Spouse name: Not on file   Number of children: Not on file   Years of education: Not on file   Highest education level: Not on file  Occupational History   Not on file  Tobacco Use   Smoking status: Passive Smoke Exposure - Never Smoker   Smokeless tobacco: Never  Substance and Sexual Activity   Alcohol use: No   Drug use: No   Sexual activity: Yes    Birth control/protection: Condom    Comment: occasional  Other Topics Concern   Not on file  Social History Narrative   Not on file   Social Drivers of Health   Financial Resource Strain: Not on file  Food  Insecurity: Not on file  Transportation Needs: Not on file  Physical Activity: Not on file  Stress: Not on file  Social Connections: Not on file  Intimate Partner Violence: Not on file   No current facility-administered medications on file prior to encounter.   Current Outpatient Medications on File Prior to Encounter  Medication Sig Dispense Refill   acetaminophen  (TYLENOL ) 500 MG tablet Take 1,000 mg by mouth every 6 (six) hours as needed.     chlorhexidine  (PERIDEX ) 0.12 % solution Use as directed 15 mLs in the mouth or throat 2 (two) times daily. 120 mL 0   diclofenac  Sodium (VOLTAREN ) 1 % GEL Apply 2 g topically 4 (four) times daily. 100 g 0   ibuprofen  (ADVIL ) 800 MG tablet Take 1 tablet (800 mg total) by mouth every 8 (eight) hours as needed for moderate pain. 21 tablet 0   methocarbamol  (ROBAXIN ) 500 MG tablet Take 2 tablets (1,000 mg total) by mouth every 8 (eight) hours as needed for muscle spasms. 20 tablet 0   Multiple Vitamins-Minerals (MULTIVITAMIN GUMMIES ADULT PO) Take by mouth.     promethazine  (PHENERGAN ) 25 MG tablet Take 1 tablet (25 mg total) by mouth every 6 (six) hours as needed for nausea or vomiting. 20 tablet 0   pseudoephedrine-dextromethorphan-guaifenesin (ROBITUSSIN-PE) 30-10-100 MG/5ML solution Take 10 mLs by mouth 4 (four) times daily as needed for cough.  No Known Allergies  I have reviewed patient's Past Medical Hx, Surgical Hx, Family Hx, Social Hx, medications and allergies.   ROS:  Review of Systems  Constitutional:  Negative for chills.  Gastrointestinal:  Positive for abdominal pain. Negative for diarrhea.  Genitourinary:  Positive for vaginal bleeding.  Musculoskeletal:  Negative for back pain.   Review of Systems  Other systems negative   Physical Exam  Physical Exam Patient Vitals for the past 24 hrs:  BP Temp Pulse Resp SpO2 Height Weight  01/14/24 0001 122/73 -- -- -- -- -- --  01/13/24 2358 -- 98.8 F (37.1 C) 80 16 99 % 5' 5  (1.651 m) 126.1 kg   Constitutional: Well-developed, well-nourished female in no acute distress.  Cardiovascular: normal rate Respiratory: normal effort GI: Abd soft, non-tender. MS: Extremities nontender, no edema, normal ROM Neurologic: Alert and oriented x 4.   PELVIC EXAM: deferred  LAB RESULTS Results for orders placed or performed during the hospital encounter of 01/13/24 (from the past 24 hours)  Pregnancy, urine POC     Status: None   Collection Time: 01/14/24 12:24 AM  Result Value Ref Range   Preg Test, Ur NEGATIVE NEGATIVE  hCG, quantitative, pregnancy     Status: None   Collection Time: 01/14/24  1:25 AM  Result Value Ref Range   hCG, Beta Chain, Quant, S <1 <5 mIU/mL  ABO/Rh     Status: None   Collection Time: 01/14/24  1:25 AM  Result Value Ref Range   ABO/RH(D) A POS    No rh immune globuloin      NOT A RH IMMUNE GLOBULIN CANDIDATE, PT RH POSITIVE Performed at [facility], [address] [place], [place] [address]   CBC     Status: None   Collection Time: 01/14/24  1:25 AM  Result Value Ref Range   WBC 8.2 4.0 - 10.5 K/uL   RBC 4.36 3.87 - 5.11 MIL/uL   Hemoglobin 13.1 12.0 - 15.0 g/dL   HCT 60.4 63.9 - 53.9 %   MCV 90.6 80.0 - 100.0 fL   MCH 30.0 26.0 - 34.0 pg   MCHC 33.2 30.0 - 36.0 g/dL   RDW 87.1 88.4 - 84.4 %   Platelets 190 150 - 400 K/uL   nRBC 0.0 0.0 - 0.2 %  Comprehensive metabolic panel     Status: Abnormal   Collection Time: 01/14/24  1:25 AM  Result Value Ref Range   Sodium 139 135 - 145 mmol/L   Potassium 3.6 3.5 - 5.1 mmol/L   Chloride 108 98 - 111 mmol/L   CO2 23 22 - 32 mmol/L   Glucose, Bld 100 (H) 70 - 99 mg/dL   BUN 11 6 - 20 mg/dL   Creatinine, Ser 9.40 0.44 - 1.00 mg/dL   Calcium 8.7 (L) 8.9 - 10.3 mg/dL   Total Protein 6.3 (L) 6.5 - 8.1 g/dL   Albumin 3.5 3.5 - 5.0 g/dL   AST 25 15 - 41 U/L   ALT 22 0 - 44 U/L   Alkaline Phosphatase 37 (L) 38 - 126 U/L   Total Bilirubin 0.9 0.0 - 1.2 mg/dL   GFR, Estimated >39  >39 mL/min   Anion gap 8 5 - 15    --/--/A POS (07/23 0125)  IMAGING No results found.  MAU Management/MDM: I have reviewed the triage vital signs and the nursing notes.   Pertinent labs & imaging results that were available during my care of  the patient were reviewed by me and considered in my medical decision making (see chart for details).      I have reviewed her medical records including past results, notes and treatments. Medical, Surgical, and family history were reviewed.  Medications and recent lab tests were reviewed  Ordered usual first trimester r/o ectopic labs.   Quant HCG is negative  This bleeding/pain can represent a normal pregnancy with bleeding, spontaneous abortion or even an ectopic which can be life-threatening.  The process as listed above helps to determine which of these is present.  Explained negative Quant HCG test.  States she  gets GYN care at [facility] department.   Advised she can followup there and request a workup for irregular menses.   ASSESSMENT 1. Negative pregnancy test   2. Vaginal bleeding   3. Pelvic cramping     PLAN Discharge home Followup at [facility] for GYN care   Follow-up Information     OB GYN of your choice. Schedule an appointment as soon as possible for a visit.                 Pt stable at time of discharge. Encouraged to return here if she develops worsening of symptoms, increase in pain, fever, or other concerning symptoms.    Earnie Pouch CNM, MSN Certified Nurse-Midwife 01/14/2024  2:33 AM

## 2024-06-21 ENCOUNTER — Inpatient Hospital Stay (HOSPITAL_COMMUNITY)
Admission: AD | Admit: 2024-06-21 | Discharge: 2024-06-21 | Disposition: A | Discharge status: Home / Self Care | Payer: Self-pay | Attending: Obstetrics and Gynecology | Admitting: Obstetrics and Gynecology

## 2024-06-21 ENCOUNTER — Other Ambulatory Visit: Payer: Self-pay

## 2024-06-21 DIAGNOSIS — Z791 Long term (current) use of non-steroidal anti-inflammatories (NSAID): Secondary | ICD-10-CM | POA: Diagnosis not present

## 2024-06-21 DIAGNOSIS — Z7722 Contact with and (suspected) exposure to environmental tobacco smoke (acute) (chronic): Secondary | ICD-10-CM | POA: Diagnosis not present

## 2024-06-21 DIAGNOSIS — O219 Vomiting of pregnancy, unspecified: Secondary | ICD-10-CM

## 2024-06-21 DIAGNOSIS — Z3A08 8 weeks gestation of pregnancy: Secondary | ICD-10-CM | POA: Diagnosis not present

## 2024-06-21 DIAGNOSIS — Z3201 Encounter for pregnancy test, result positive: Secondary | ICD-10-CM

## 2024-06-21 LAB — URINALYSIS, ROUTINE W REFLEX MICROSCOPIC
Bilirubin Urine: NEGATIVE
Glucose, UA: NEGATIVE mg/dL
Hgb urine dipstick: NEGATIVE
Ketones, ur: NEGATIVE mg/dL
Leukocytes,Ua: NEGATIVE
Nitrite: NEGATIVE
Protein, ur: NEGATIVE mg/dL
Specific Gravity, Urine: 1.014 (ref 1.005–1.030)
pH: 8 (ref 5.0–8.0)

## 2024-06-21 LAB — POCT PREGNANCY, URINE
Preg Test, Ur: POSITIVE — AB
Preg Test, Ur: POSITIVE — AB

## 2024-06-21 MED ORDER — ONDANSETRON 4 MG PO TBDP
8.0000 mg | ORAL_TABLET | Freq: Once | ORAL | Status: DC
  Filled 2024-06-21: qty 2

## 2024-06-21 MED ORDER — PROMETHAZINE HCL 25 MG PO TABS: 25.0000 mg | ORAL_TABLET | Freq: Four times a day (QID) | ORAL | 0 refills | Status: AC | PRN

## 2024-06-21 NOTE — MAU Provider Note (Signed)
 Chief Complaint:  Possible Pregnancy   HPI   None     Tara Blanchard is a 28 y.o. G1P0 at ~7-8 weeks per unsure LMP who presents to maternity admissions reporting positive pregnancy test this morning. Desires an ultrasound to ensure everything is okay. Denies vaginal bleeding, leakage of fluid, abdominal pain. Was having nausea and vomiting the last 2 days. Denies blood in the vomit.  Pregnancy Course: Recent pregnancy test this morning, not established with a OBGYN  No past medical history on file. OB History  Gravida Para Term Preterm AB Living  1       SAB IAB Ectopic Multiple Live Births          # Outcome Date GA Lbr Len/2nd Weight Sex Type Anes PTL Lv  1 Current            Past Surgical History:  Procedure Laterality Date   CHOLECYSTECTOMY  08/09/2014   CHOLECYSTECTOMY  08/09/2014   Procedure: LAPAROSCOPIC CHOLECYSTECTOMY;  Surgeon: Dann Hummer, MD;  Location: [facility] OR;  Service: General;;   HERNIA REPAIR     No family history on file. Social History[1] Allergies[2] Medications Prior to Admission  Medication Sig Dispense Refill Last Dose/Taking   acetaminophen  (TYLENOL ) 500 MG tablet Take 1,000 mg by mouth every 6 (six) hours as needed.      chlorhexidine  (PERIDEX ) 0.12 % solution Use as directed 15 mLs in the mouth or throat 2 (two) times daily. 120 mL 0    diclofenac  Sodium (VOLTAREN ) 1 % GEL Apply 2 g topically 4 (four) times daily. 100 g 0    ibuprofen  (ADVIL ) 800 MG tablet Take 1 tablet (800 mg total) by mouth every 8 (eight) hours as needed for moderate pain. 21 tablet 0    methocarbamol  (ROBAXIN ) 500 MG tablet Take 2 tablets (1,000 mg total) by mouth every 8 (eight) hours as needed for muscle spasms. 20 tablet 0    Multiple Vitamins-Minerals (MULTIVITAMIN GUMMIES ADULT PO) Take by mouth.      pseudoephedrine-dextromethorphan-guaifenesin (ROBITUSSIN-PE) 30-10-100 MG/5ML solution Take 10 mLs by mouth 4 (four) times daily as needed for cough.       [DISCONTINUED] promethazine  (PHENERGAN ) 25 MG tablet Take 1 tablet (25 mg total) by mouth every 6 (six) hours as needed for nausea or vomiting. 20 tablet 0     I have reviewed patient's Past Medical Hx, Surgical Hx, Family Hx, Social Hx, medications and allergies.   ROS  Pertinent items noted in HPI and remainder of comprehensive ROS otherwise negative.   PHYSICAL EXAM  Patient Vitals for the past 24 hrs:  BP Temp Pulse Resp SpO2 Height Weight  06/21/24 1426 (!) 108/57 98.6 F (37 C) 82 12 98 % -- --  06/21/24 1422 -- -- -- -- -- 5' 5 (1.651 m) 126.2 kg    Constitutional: Well-developed, well-nourished female in no acute distress.  Cardiovascular: normal rate & rhythm, warm and well-perfused Respiratory: normal effort, no problems with respiration noted GI: Abd soft, non-tender, non-distended MS: Extremities nontender, no edema, normal ROM Neurologic: Alert and oriented x 4.     Labs: Results for orders placed or performed during the hospital encounter of 06/21/24 (from the past 24 hours)  Urinalysis, Routine w reflex microscopic -Urine, Clean Catch     Status: None   Collection Time: 06/21/24  2:50 PM  Result Value Ref Range   Color, Urine YELLOW YELLOW   APPearance CLEAR CLEAR   Specific Gravity, Urine 1.014 1.005 - 1.030  pH 8.0 5.0 - 8.0   Glucose, UA NEGATIVE NEGATIVE mg/dL   Hgb urine dipstick NEGATIVE NEGATIVE   Bilirubin Urine NEGATIVE NEGATIVE   Ketones, ur NEGATIVE NEGATIVE mg/dL   Protein, ur NEGATIVE NEGATIVE mg/dL   Nitrite NEGATIVE NEGATIVE   Leukocytes,Ua NEGATIVE NEGATIVE  Pregnancy, urine POC     Status: Abnormal   Collection Time: 06/21/24  5:00 PM  Result Value Ref Range   Preg Test, Ur POSITIVE (A) NEGATIVE    Imaging:  No results found.  MDM & MAU COURSE  MDM: Low  MAU Course: Orders Placed This Encounter  Procedures   Urinalysis, Routine w reflex microscopic -Urine, Clean Catch   Pregnancy, urine POC   Discharge patient   Meds ordered  this encounter  Medications   ondansetron  (ZOFRAN -ODT) disintegrating tablet 8 mg   promethazine  (PHENERGAN ) 25 MG tablet    Sig: Take 1 tablet (25 mg total) by mouth every 6 (six) hours as needed for nausea or vomiting.    Dispense:  20 tablet    Refill:  0   VSS. Offered zofran  but patient denies nausea at this time. On assessment, she continues to state that she is well and is requesting the ultrasound because she would like to see the baby. Explained that the MAU does not do ultrasounds unless there is a medical indication and she feels and appears well. Congratulated on her pregnancy, and provided list of prenatal providers in the area. Message sent to [facility] to assist with ultrasound appointment. Provided prescription for nausea medication per patient request. All questions answered.  ASSESSMENT   1. Positive urine pregnancy test   2. Nausea and vomiting in pregnancy prior to [redacted] weeks gestation     PLAN  Discharge home in stable condition with return precautions.  Message sent for initial prenatal and ultrasound appointment.    Allergies as of 06/21/2024   No Known Allergies      Medication List     STOP taking these medications    acetaminophen  500 MG tablet Commonly known as: TYLENOL    chlorhexidine  0.12 % solution Commonly known as: Peridex    diclofenac  Sodium 1 % Gel Commonly known as: Voltaren    ibuprofen  800 MG tablet Commonly known as: ADVIL    methocarbamol  500 MG tablet Commonly known as: ROBAXIN    MULTIVITAMIN GUMMIES ADULT PO   pseudoephedrine-dextromethorphan-guaifenesin 30-10-100 MG/5ML solution Commonly known as: ROBITUSSIN-PE       TAKE these medications    promethazine  25 MG tablet Commonly known as: PHENERGAN  Take 1 tablet (25 mg total) by mouth every 6 (six) hours as needed for nausea or vomiting.        Charlie Courts, MD  Family Medicine - Obstetrics Fellow        [1]  Social History Tobacco Use   Smoking status: Passive  Smoke Exposure - Never Smoker   Smokeless tobacco: Never  Substance Use Topics   Alcohol use: No   Drug use: No  [2] No Known Allergies

## 2024-06-21 NOTE — Discharge Instructions (Signed)
 You came in for nausea and vomiting, but you are feeling better. Please reach out to a clinic that you would like to go to for your prenatal care.   Prenatal Care Providers           [facility] for [facility] @ [facility]  [address] [phone]  [facility] for [facility] @ [facility]   [address]  [phone]  [facility] @ [facility]       [address] [phone]            [facility] for [facility] @ [place]     [phone]          Center for [facility] @ [facility]   [address] [phone]  [facility] for [facility] @ [facility]  [address] [phone]     [facility] for [facility] @ [address] [place])  [address]   [phone]     [facility]  Phone: [phone]  [facility]  Phone: [phone]  [facility] Phone: [phone]  [facility] Phone: [phone]  [facility] Phone: [phone]  [facility]  Phone: [phone]

## 2024-06-21 NOTE — MAU Note (Addendum)
.  Tara Blanchard is a 28 y.o. at Unknown here in MAU reporting: Pregnancy test this morning was positive. Denies pain, denies vaginal bleeding. Would like u/s today to check on baby to see if everything is okay and how far along she is  Nauseating and vomiting for 2 days  Patient reports she was donating plasma and did not know she was pregnant.  LMP: unsure  Onset of complaint: today  Pain score: denies Vitals:   06/21/24 1426  BP: (!) 108/57  Pulse: 82  Resp: 12  Temp: 98.6 F (37 C)  SpO2: 98%      Lab orders placed from triage: poct preg  ua

## 2024-06-29 ENCOUNTER — Telehealth: Payer: Self-pay

## 2024-06-29 NOTE — Telephone Encounter (Signed)
-----   Message from Charlie Courts, MD sent at 06/24/2024  8:44 PM EST ----- Regarding: FW: Initial Prenatal and Ultrasound Hello!  May we schedule this patient for an initial OB visit and ultrasound? She presented to the MAU for an ultrasound but was not having any symptoms or concerns at that time, just a positive home pregnancy test. Thank you!  Charlie DELENA Courts, MD ----- Message ----- From: Reggy Tedi DELENA Sent: 06/23/2024  10:46 AM EST To: Charlie DELENA Courts, MD Subject: RE: Initial Prenatal and Ultrasound            Patient lives in [place]. Can you send this request to [facility]. It's closer to her.  Thanks! Antoinette ----- Message ----- From: Courts Charlie DELENA, MD Sent: 06/22/2024   6:22 PM EST To: [facility] Admin Pool Subject: Initial Prenatal and Ultrasound                Hello!  This patient is about 7-[redacted] weeks pregnant per unsure LMP. She needs an OB intake and ultrasound appointment. Thanks!  Charlie DELENA Courts, MD

## 2024-06-29 NOTE — Telephone Encounter (Signed)
 Called patient to get scheduled for a dating ultrasound. Left message on voicemail.

## 2024-07-07 LAB — LAB REPORT - SCANNED
A1c: 5.3
EGFR: 126
HM Hepatitis Screen: NEGATIVE

## 2024-07-20 ENCOUNTER — Telehealth: Payer: Self-pay

## 2024-07-20 NOTE — Telephone Encounter (Signed)
 Second attempt: Left message on patient's voicemail about calling office to get dating ultrasound scheduled.

## 2024-07-21 ENCOUNTER — Other Ambulatory Visit: Payer: Self-pay

## 2024-07-21 DIAGNOSIS — E669 Obesity, unspecified: Secondary | ICD-10-CM

## 2024-09-21 ENCOUNTER — Ambulatory Visit
